# Patient Record
Sex: Male | Born: 1955 | Race: Black or African American | Hispanic: No | Marital: Single | Smoking: Current every day smoker
Health system: Southern US, Community
[De-identification: ages and names within clinical notes are randomized; demographics above are authoritative.]

## PROBLEM LIST (undated history)

## (undated) DIAGNOSIS — K759 Inflammatory liver disease, unspecified: Secondary | ICD-10-CM

## (undated) DIAGNOSIS — M4800 Spinal stenosis, site unspecified: Secondary | ICD-10-CM

## (undated) DIAGNOSIS — E119 Type 2 diabetes mellitus without complications: Secondary | ICD-10-CM

## (undated) DIAGNOSIS — E785 Hyperlipidemia, unspecified: Secondary | ICD-10-CM

## (undated) DIAGNOSIS — M1990 Unspecified osteoarthritis, unspecified site: Secondary | ICD-10-CM

## (undated) HISTORY — PX: HERNIA REPAIR: SHX51

## (undated) HISTORY — PX: KNEE ARTHROSCOPY: SUR90

## (undated) HISTORY — PX: WRIST SURGERY: SHX841

## (undated) HISTORY — PX: EYE SURGERY: SHX253

## (undated) HISTORY — PX: EXCISIONAL HEMORRHOIDECTOMY: SHX1541

---

## 2015-09-08 ENCOUNTER — Ambulatory Visit: Payer: Self-pay | Admitting: Orthopedic Surgery

## 2015-09-08 NOTE — H&P (Signed)
Rod Majerus is an 60 y.o. male.   Chief Complaint:  L lower extremity radicular pain HPI: The patient is a 60 year old male who presents with back pain. The patient is here today in referral from Dr. Ethelene Hal. The patient reports low back symptoms including pain which began 21 week(s) (1 day) ago following a specific injury (DOI 02/03/15). The injury occurred at work due to twisting. and Symptoms include pain (low back). The pain radiates to the left buttock and left posterior thigh. The patient describes the pain as aching. The patient describes the severity of their symptoms as 3 / 10. Symptoms are exacerbated by sitting. Current treatment includes opioid analgesics (Norco) and muscle relaxants (Flexeril). 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 Past evaluation has included x-ray of the lumbar spine and MRI of the lumbar spine. Past treatment has included opioid analgesics (Hydrocodone), muscle relaxants (Flexeril), corticosteroids and L L4-5 facet injection and L L5 SNRB. Note for "Back pain": The patient is working full duty. NCM Neita Carp.  This is a very pleasant gentleman who was kindly referred by Dr. Ethelene Hal for the evaluation of ongoing left lower extremity radicular pain secondary to a work-related injury. The patient reports injured on 02/03/2015 with a direct blow that hit to the left foot with a backhoe caused him to twist in his lower back. He had pain in the low back, left buttock, radiating down into the foot. He was seen at [facility]. X-rays of the lumbar spine and MRI was performed. The patient was treated with oral prednisone, hydrocodone. The patient has significant facet arthropathy at 4-5 and lateral recess and associated synovial cyst impinging upon the L4 and L5 nerve roots. He was seen by Dr. Ethelene Hal. He was started with a selective nerve root block on the left at L5. On the nerve root block, he had reproduction of his pain in  the L5 nerve root distribution to the top of his foot and to the bottom of his foot. He was placed on Norco and felt that the injection overall reduced the pain to his knee as opposed to down below his foot and the thought was to reduce the synovial cyst and the inflammation. He has second injection at L4-5. It seemed to help as well in terms of his back pain, but he has had persistent buttock and leg pain. He is continued to do his regular duties, taking Norco and Flexeril, but does not want to live permanently with these symptoms. Again, he has 21 weeks of duration.  Review of systems is negative for fevers, chest pain, shortness of breath, unexplained recent weight loss, loss of bowel or bladder function, burning with urination, joint swelling, rashes, weakness or numbness, difficulty with balance, easy bruising, excessive thirst or frequent urination.  He is here with his case manager Neita Carp. He has reported no problems prior to that. No fevers or chills, change in bowel or bladder function, pain that awakens him at night, or unexplained recent weight loss. He is an insulin-dependent diabetic and he indicates his A1c is elevated; however, he does not have a particular quantification today.  No past medical history on file.  No past surgical history on file.  No family history on file. Social History:  has no tobacco, alcohol, and drug history on file.  Allergies: Allergies not on file   (Not in a hospital admission)  No results found for this or any previous visit (from the  past 48 hour(s)). No results found.  Review of Systems  Constitutional: Negative.   HENT: Negative.   Eyes: Negative.   Respiratory: Negative.   Cardiovascular: Negative.   Gastrointestinal: Negative.   Genitourinary: Negative.   Musculoskeletal: Positive for back pain.  Skin: Negative.   Neurological: Positive for sensory change and focal weakness.  Psychiatric/Behavioral: Negative.     There were no  vitals taken for this visit. Physical Exam  Constitutional: He is oriented to person, place, and time. He appears well-developed.  HENT:  Head: Normocephalic.  Eyes: Pupils are equal, round, and reactive to light.  Neck: Normal range of motion.  Cardiovascular: Normal rate.   Respiratory: Effort normal.  GI: Soft.  Musculoskeletal:  I see a healthy male, mild amount of distress. Mood and affect is appropriate. Slight antalgic gait. He is tender in the left proximal gluteus, nontender over the SI joint or lumbosacral junction. Straight leg raise, buttock, thigh and calf pain left, negative on the right. EHL is 4+/5 on the left compared to the right. Slightly altered sensation in the L5 dermatome in the plantar aspect of the foot. Good pulses. No DVT. No instability of hips, knees and ankles. He has decreased range of motion of both hips internal and external rotation, but no reproduction of his pain. Nontender over the thoracic spine. Abdomen is soft and nontender. Pelvis is stable. Painless range of motion of the cervical spine is noted.  Neurological: He is alert and oriented to person, place, and time.    Three view radiographs AP, lateral, flexion and extension radiographs of the lumbar spine demonstrates multilevel lumbar spondylosis with facet arthropathy at 4-5 and at 5-1. He has a mild osteoarthrosis of the hips as noted. His MRI done outside of [facility], interpreted by Dr. 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 Moderate neural foraminal narrowing is noted. Probable symptomatic nerve root impingement. There is advanced arthropathy at 5-1. No definitive lateral recess stenosis is noted. Moderate facet arthropathy at 3-4 is noted.  On his x-rays, he has congenitally short  pedicles.  Assessment/Plan 1. 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 He has temporally relief from a L5 selective nerve root block and facet injection. He has ongoing symptoms required narcotic analgesics. He has continued to work however, but he is unsure as to how long he continue with his current level of pain. 2. Tobacco dependence.  Extensive discussion with Mr. Ines concerning his current pathology, relevant anatomy and treatment options. Certainly, he has had excellent treatment today on the appropriate diagnosis. I feel this is related as Dr. Ethelene Hal has indicated, to impingement of the L4 and L5 nerve root on the left secondary to facet arthropathy. He has reproduction of his pain and temporarily relief from a selective nerve root block and facet injection. He is currently utilizing narcotic analgesics as well as Flexeril and has persistent significant pain. He has neural tension signs and EHL weakness. One option is to continue with his current treatment with pain management, activity modification; however, we would most probably expect the ongoing symptoms as they currently exist. Second option is to consider a micro lumbar decompression at 4-5 to decompress the lateral recess  and partial medial hemi-facetectomy with foraminotomies at L4 and L5 and removal of synovial cyst. We did discuss that this would address his radicular or nerve compression pain as was reproduced and diminished by his selective nerve root block. There would most likely be residual symptoms related to his multilevel facet arthropathy. He does not have instability. I would not recommend a concomitant fusion at this point in time given the relative risk and benefit ratio. I would also load the adjacent segments. I did however, indicate that with a any facet  arthropathy that the procedure is directed towards removing neural impingement, this does not eradicate the facet arthropathy at multiple levels and could have ongoing back pain associated with that. He has minimal back pain that is predominantly buttock and leg pain at this point; therefore as well, there is a possibility of a recurrent synovial cyst production and in which case it would be treated in a similar manner. However, given his comorbidities, I believe a minimally invasive approach at this point in time is prudent. Continue with his regular duties, Norco and Flexeril. I discussed this in front of the patient with Neita Carp. We will obtain a preoperative clearance. We will recommend tobacco cessation as well. He has a history of liver disease and hepatitis C. We will proceed as one authorization has been obtained. I appreciate the kind referral and taking this evaluation by Dr. Ethelene Hal.  Plan microlumbar decompression L4-5 left, removal of synovial cyst  BISSELL, JACLYN M. PA-C for Dr. Shelle Iron 09/08/2015, 4:39 PM

## 2015-09-09 NOTE — Patient Instructions (Addendum)
YOUR PROCEDURE IS SCHEDULED ON :  09/15/15  REPORT TO [facility] MAIN ENTRANCE FOLLOW SIGNS TO EAST ELEVATOR - GO TO 3rd FLOOR CHECK IN AT 3 EAST NURSES STATION (SHORT STAY) AT:  5:30 AM  CALL THIS NUMBER IF YOU HAVE PROBLEMS THE MORNING OF SURGERY [phone]  REMEMBER:ONLY 1 PER PERSON MAY GO TO SHORT STAY WITH YOU TO GET READY THE MORNING OF YOUR SURGERY  DO NOT EAT FOOD OR DRINK LIQUIDS AFTER MIDNIGHT  TAKE THESE MEDICINES THE MORNING OF SURGERY: GABAPENTIN / MAY TAKE HYDROCODONE IF NEEDED FOR PAIN  TAKE 1/2 DOSE OF LEVIMIR THE NIGHT BEFORE SURGERY  YOU MAY NOT HAVE ANY METAL ON YOUR BODY INCLUDING HAIR PINS AND PIERCING'S. DO NOT WEAR JEWELRY, MAKEUP, LOTIONS, POWDERS OR PERFUMES. DO NOT WEAR NAIL POLISH. DO NOT SHAVE 48 HRS PRIOR TO SURGERY. MEN MAY SHAVE FACE AND NECK.  DO NOT BRING VALUABLES TO HOSPITAL. [facility] IS NOT RESPONSIBLE FOR VALUABLES.  CONTACTS, DENTURES OR PARTIALS MAY NOT BE WORN TO SURGERY. LEAVE SUITCASE IN CAR. CAN BE BROUGHT TO ROOM AFTER SURGERY.  PATIENTS DISCHARGED THE DAY OF SURGERY WILL NOT BE ALLOWED TO DRIVE HOME.  PLEASE READ OVER THE FOLLOWING INSTRUCTION SHEETS _________________________________________________________________________________                                          [facility] - PREPARING FOR SURGERY  Before surgery, you can play an important role.  Because skin is not sterile, your skin needs to be as free of germs as possible.  You can reduce the number of germs on your skin by washing with CHG (chlorahexidine gluconate) soap before surgery.  CHG is an antiseptic cleaner which kills germs and bonds with the skin to continue killing germs even after washing. Please DO NOT use if you have an allergy to CHG or antibacterial soaps.  If your skin becomes reddened/irritated stop using the CHG and inform your nurse when you arrive at Short Stay. Do not shave (including legs and underarms) for at least 48 hours  prior to the first CHG shower.  You may shave your face. Please follow these instructions carefully:   1.  Shower with CHG Soap the night before surgery and the  morning of Surgery.   2.  If you choose to wash your hair, wash your hair first as usual with your  normal  Shampoo.   3.  After you shampoo, rinse your hair and body thoroughly to remove the  shampoo.                                         4.  Use CHG as you would any other liquid soap.  You can apply chg directly  to the skin and wash . Gently wash with scrungie or clean wascloth    5.  Apply the CHG Soap to your body ONLY FROM THE NECK DOWN.   Do not use on open                           Wound or open sores. Avoid contact with eyes, ears mouth and genitals (private parts).  Genitals (private parts) with your normal soap.              6.  Wash thoroughly, paying special attention to the area where your surgery  will be performed.   7.  Thoroughly rinse your body with warm water from the neck down.   8.  DO NOT shower/wash with your normal soap after using and rinsing off  the CHG Soap .                9.  Pat yourself dry with a clean towel.             10.  Wear clean night clothes to bed after shower             11.  Place clean sheets on your bed the night of your first shower and do not  sleep with pets.  Day of Surgery : Do not apply any lotions/deodorants the morning of surgery.  Please wear clean clothes to the hospital/surgery center.  FAILURE TO FOLLOW THESE INSTRUCTIONS MAY RESULT IN THE CANCELLATION OF YOUR SURGERY    PATIENT SIGNATURE_________________________________  ______________________________________________________________________     Sean Brooks  An incentive spirometer is a tool that can help keep your lungs clear and active. This tool measures how well you are filling your lungs with each breath. Taking long deep breaths may help reverse or decrease the  chance of developing breathing (pulmonary) problems (especially infection) following:  A long period of time when you are unable to move or be active. BEFORE THE PROCEDURE   If the spirometer includes an indicator to show your best effort, your nurse or respiratory therapist will set it to a desired goal.  If possible, sit up straight or lean slightly forward. Try not to slouch.  Hold the incentive spirometer in an upright position. INSTRUCTIONS FOR USE  1. Sit on the edge of your bed if possible, or sit up as far as you can in bed or on a chair. 2. Hold the incentive spirometer in an upright position. 3. Breathe out normally. 4. Place the mouthpiece in your mouth and seal your lips tightly around it. 5. Breathe in slowly and as deeply as possible, raising the piston or the ball toward the top of the column. 6. Hold your breath for 3-5 seconds or for as long as possible. Allow the piston or ball to fall to the bottom of the column. 7. Remove the mouthpiece from your mouth and breathe out normally. 8. Rest for a few seconds and repeat Steps 1 through 7 at least 10 times every 1-2 hours when you are awake. Take your time and take a few normal breaths between deep breaths. 9. The spirometer may include an indicator to show your best effort. Use the indicator as a goal to work toward during each repetition. 10. After each set of 10 deep breaths, practice coughing to be sure your lungs are clear. If you have an incision (the cut made at the time of surgery), support your incision when coughing by placing a pillow or rolled up towels firmly against it. Once you are able to get out of bed, walk around indoors and cough well. You may stop using the incentive spirometer when instructed by your caregiver.  RISKS AND COMPLICATIONS  Take your time so you do not get dizzy or light-headed.  If you are in pain, you may need to take or ask for pain medication before doing incentive spirometry. It is  harder  to take a deep breath if you are having pain. AFTER USE  Rest and breathe slowly and easily.  It can be helpful to keep track of a log of your progress. Your caregiver can provide you with a simple table to help with this. If you are using the spirometer at home, follow these instructions: [place] IF:   You are having difficultly using the spirometer.  You have trouble using the spirometer as often as instructed.  Your pain medication is not giving enough relief while using the spirometer.  You develop fever of 100.5 F (38.1 C) or higher. SEEK IMMEDIATE MEDICAL CARE IF:   You cough up bloody sputum that had not been present before.  You develop fever of 102 F (38.9 C) or greater.  You develop worsening pain at or near the incision site. MAKE SURE YOU:   Understand these instructions.  Will watch your condition.  Will get help right away if you are not doing well or get worse. Document Released: 12/18/2006 Document Revised: 10/30/2011 Document Reviewed: 02/18/2007 [facility] Patient Information 2014 [place], [place].   ________________________________________________________________________

## 2015-09-10 ENCOUNTER — Encounter (HOSPITAL_COMMUNITY)
Admission: RE | Admit: 2015-09-10 | Discharge: 2015-09-10 | Disposition: A | Discharge status: Home / Self Care | Payer: Worker's Compensation | Source: Ambulatory Visit | Attending: Specialist | Admitting: Specialist

## 2015-09-10 ENCOUNTER — Ambulatory Visit (HOSPITAL_COMMUNITY)
Admission: RE | Admit: 2015-09-10 | Discharge: 2015-09-10 | Disposition: A | Discharge status: Home / Self Care | Payer: Worker's Compensation | Source: Ambulatory Visit | Attending: Orthopedic Surgery | Admitting: Orthopedic Surgery

## 2015-09-10 ENCOUNTER — Encounter (HOSPITAL_COMMUNITY): Payer: Self-pay

## 2015-09-10 DIAGNOSIS — R9431 Abnormal electrocardiogram [ECG] [EKG]: Secondary | ICD-10-CM | POA: Insufficient documentation

## 2015-09-10 DIAGNOSIS — Z01812 Encounter for preprocedural laboratory examination: Secondary | ICD-10-CM | POA: Insufficient documentation

## 2015-09-10 DIAGNOSIS — M4316 Spondylolisthesis, lumbar region: Secondary | ICD-10-CM | POA: Diagnosis not present

## 2015-09-10 DIAGNOSIS — M47896 Other spondylosis, lumbar region: Secondary | ICD-10-CM | POA: Diagnosis not present

## 2015-09-10 DIAGNOSIS — E119 Type 2 diabetes mellitus without complications: Secondary | ICD-10-CM | POA: Insufficient documentation

## 2015-09-10 DIAGNOSIS — Z01810 Encounter for preprocedural cardiovascular examination: Secondary | ICD-10-CM | POA: Diagnosis not present

## 2015-09-10 DIAGNOSIS — M4806 Spinal stenosis, lumbar region: Secondary | ICD-10-CM | POA: Insufficient documentation

## 2015-09-10 DIAGNOSIS — M48061 Spinal stenosis, lumbar region without neurogenic claudication: Secondary | ICD-10-CM

## 2015-09-10 DIAGNOSIS — Z01818 Encounter for other preprocedural examination: Secondary | ICD-10-CM | POA: Diagnosis present

## 2015-09-10 HISTORY — DX: Type 2 diabetes mellitus without complications: E11.9

## 2015-09-10 HISTORY — DX: Unspecified osteoarthritis, unspecified site: M19.90

## 2015-09-10 HISTORY — DX: Spinal stenosis, site unspecified: M48.00

## 2015-09-10 HISTORY — DX: Inflammatory liver disease, unspecified: K75.9

## 2015-09-10 HISTORY — DX: Hyperlipidemia, unspecified: E78.5

## 2015-09-10 LAB — SURGICAL PCR SCREEN
MRSA, PCR: NEGATIVE
Staphylococcus aureus: NEGATIVE

## 2015-09-10 LAB — CBC
HEMATOCRIT: 43.4 % (ref 39.0–52.0)
Hemoglobin: 12.8 g/dL — ABNORMAL LOW (ref 13.0–17.0)
MCH: 25.1 pg — AB (ref 26.0–34.0)
MCHC: 29.5 g/dL — AB (ref 30.0–36.0)
MCV: 85.3 fL (ref 78.0–100.0)
Platelets: 373 10*3/uL (ref 150–400)
RBC: 5.09 MIL/uL (ref 4.22–5.81)
RDW: 15.1 % (ref 11.5–15.5)
WBC: 7.6 10*3/uL (ref 4.0–10.5)

## 2015-09-10 LAB — BASIC METABOLIC PANEL
Anion gap: 8 (ref 5–15)
BUN: 18 mg/dL (ref 6–20)
CHLORIDE: 107 mmol/L (ref 101–111)
CO2: 24 mmol/L (ref 22–32)
Calcium: 9.2 mg/dL (ref 8.9–10.3)
Creatinine, Ser: 1.11 mg/dL (ref 0.61–1.24)
GFR calc Af Amer: >60 mL/min (ref >60)
GFR calc non Af Amer: >60 mL/min (ref >60)
GLUCOSE: 63 mg/dL — AB (ref 65–99)
POTASSIUM: 4.6 mmol/L (ref 3.5–5.1)
Sodium: 139 mmol/L (ref 135–145)

## 2015-09-11 LAB — HEMOGLOBIN A1C
HEMOGLOBIN A1C: 8.5 % — AB (ref 4.8–5.6)
Mean Plasma Glucose: 197 mg/dL

## 2015-09-15 ENCOUNTER — Ambulatory Visit (HOSPITAL_COMMUNITY): Payer: Worker's Compensation

## 2015-09-15 ENCOUNTER — Ambulatory Visit (HOSPITAL_COMMUNITY)
Admission: RE | Admit: 2015-09-15 | Discharge: 2015-09-16 | Disposition: A | Discharge status: Home / Self Care | Payer: Worker's Compensation | Source: Ambulatory Visit | Attending: Specialist | Admitting: Specialist

## 2015-09-15 ENCOUNTER — Encounter (HOSPITAL_COMMUNITY): Payer: Self-pay

## 2015-09-15 ENCOUNTER — Encounter (HOSPITAL_COMMUNITY)
Admission: RE | Disposition: A | Discharge status: Home / Self Care | Payer: Self-pay | Source: Ambulatory Visit | Attending: Specialist

## 2015-09-15 ENCOUNTER — Ambulatory Visit (HOSPITAL_COMMUNITY): Payer: Worker's Compensation | Admitting: Anesthesiology

## 2015-09-15 DIAGNOSIS — I10 Essential (primary) hypertension: Secondary | ICD-10-CM | POA: Diagnosis not present

## 2015-09-15 DIAGNOSIS — Z6836 Body mass index (BMI) 36.0-36.9, adult: Secondary | ICD-10-CM | POA: Diagnosis not present

## 2015-09-15 DIAGNOSIS — M48061 Spinal stenosis, lumbar region without neurogenic claudication: Secondary | ICD-10-CM

## 2015-09-15 DIAGNOSIS — J449 Chronic obstructive pulmonary disease, unspecified: Secondary | ICD-10-CM | POA: Insufficient documentation

## 2015-09-15 DIAGNOSIS — B1920 Unspecified viral hepatitis C without hepatic coma: Secondary | ICD-10-CM | POA: Insufficient documentation

## 2015-09-15 DIAGNOSIS — M7138 Other bursal cyst, other site: Secondary | ICD-10-CM | POA: Insufficient documentation

## 2015-09-15 DIAGNOSIS — E785 Hyperlipidemia, unspecified: Secondary | ICD-10-CM | POA: Diagnosis not present

## 2015-09-15 DIAGNOSIS — M4806 Spinal stenosis, lumbar region: Secondary | ICD-10-CM | POA: Diagnosis not present

## 2015-09-15 DIAGNOSIS — Z794 Long term (current) use of insulin: Secondary | ICD-10-CM | POA: Insufficient documentation

## 2015-09-15 DIAGNOSIS — F17210 Nicotine dependence, cigarettes, uncomplicated: Secondary | ICD-10-CM | POA: Insufficient documentation

## 2015-09-15 DIAGNOSIS — E119 Type 2 diabetes mellitus without complications: Secondary | ICD-10-CM | POA: Diagnosis not present

## 2015-09-15 DIAGNOSIS — Z419 Encounter for procedure for purposes other than remedying health state, unspecified: Secondary | ICD-10-CM

## 2015-09-15 HISTORY — PX: LUMBAR LAMINECTOMY/DECOMPRESSION MICRODISCECTOMY: SHX5026

## 2015-09-15 LAB — GLUCOSE, CAPILLARY
GLUCOSE-CAPILLARY: 156 mg/dL — AB (ref 65–99)
GLUCOSE-CAPILLARY: 242 mg/dL — AB (ref 65–99)
Glucose-Capillary: 172 mg/dL — ABNORMAL HIGH (ref 65–99)
Glucose-Capillary: 164 mg/dL — ABNORMAL HIGH (ref 65–99)
Glucose-Capillary: 177 mg/dL — ABNORMAL HIGH (ref 65–99)

## 2015-09-15 LAB — HEPATIC FUNCTION PANEL
ALK PHOS: 70 U/L (ref 38–126)
ALT: 39 U/L (ref 17–63)
AST: 34 U/L (ref 15–41)
Albumin: 3.3 g/dL — ABNORMAL LOW (ref 3.5–5.0)
Total Bilirubin: 0.5 mg/dL (ref 0.3–1.2)
Total Protein: 6.8 g/dL (ref 6.5–8.1)

## 2015-09-15 SURGERY — LUMBAR LAMINECTOMY/DECOMPRESSION MICRODISCECTOMY
Anesthesia: General | Site: Back | Laterality: Left

## 2015-09-15 MED ORDER — INSULIN DETEMIR 100 UNIT/ML ~~LOC~~ SOLN
55.0000 [IU] | Freq: Every day | SUBCUTANEOUS | Status: DC
Start: 2015-09-15 — End: 2015-09-15

## 2015-09-15 MED ORDER — OXYCODONE-ACETAMINOPHEN 5-325 MG PO TABS
1.0000 | ORAL_TABLET | ORAL | Status: DC | PRN
  Administered 2015-09-16 (×2): 2 via ORAL
  Filled 2015-09-15 (×2): qty 2

## 2015-09-15 MED ORDER — DOCUSATE SODIUM 100 MG PO CAPS
100.0000 mg | ORAL_CAPSULE | Freq: Two times a day (BID) | ORAL | Status: DC
  Administered 2015-09-15 – 2015-09-16 (×2): 100 mg via ORAL

## 2015-09-15 MED ORDER — BISACODYL 5 MG PO TBEC: 5.0000 mg | DELAYED_RELEASE_TABLET | Freq: Every day | ORAL | Status: DC | PRN

## 2015-09-15 MED ORDER — ALUM & MAG HYDROXIDE-SIMETH 200-200-20 MG/5ML PO SUSP: 30.0000 mL | Freq: Four times a day (QID) | ORAL | Status: DC | PRN

## 2015-09-15 MED ORDER — SENNOSIDES-DOCUSATE SODIUM 8.6-50 MG PO TABS: 1.0000 | ORAL_TABLET | Freq: Every evening | ORAL | Status: DC | PRN

## 2015-09-15 MED ORDER — PROMETHAZINE HCL 25 MG/ML IJ SOLN: 6.2500 mg | INTRAMUSCULAR | Status: DC | PRN

## 2015-09-15 MED ORDER — LACTATED RINGERS IV SOLN
INTRAVENOUS | Status: DC
  Administered 2015-09-15 (×2): via INTRAVENOUS

## 2015-09-15 MED ORDER — INSULIN ASPART 100 UNIT/ML ~~LOC~~ SOLN
15.0000 [IU] | Freq: Every day | SUBCUTANEOUS | Status: DC
  Administered 2015-09-16: 15 [IU] via SUBCUTANEOUS

## 2015-09-15 MED ORDER — ONDANSETRON HCL 4 MG/2ML IJ SOLN: 4.0000 mg | INTRAMUSCULAR | Status: DC | PRN

## 2015-09-15 MED ORDER — HYDROMORPHONE HCL 1 MG/ML IJ SOLN
INTRAMUSCULAR | Status: AC
Start: 2015-09-15 — End: 2015-09-15
  Filled 2015-09-15: qty 1

## 2015-09-15 MED ORDER — MEPERIDINE HCL 50 MG/ML IJ SOLN: 6.2500 mg | INTRAMUSCULAR | Status: DC | PRN

## 2015-09-15 MED ORDER — INSULIN ASPART 100 UNIT/ML ~~LOC~~ SOLN
0.0000 [IU] | Freq: Three times a day (TID) | SUBCUTANEOUS | Status: DC
  Administered 2015-09-15: 3 [IU] via SUBCUTANEOUS
  Administered 2015-09-16: 8 [IU] via SUBCUTANEOUS

## 2015-09-15 MED ORDER — INSULIN DETEMIR 100 UNIT/ML ~~LOC~~ SOLN: 55.0000 [IU] | Freq: Every day | SUBCUTANEOUS | Status: DC

## 2015-09-15 MED ORDER — HYDROMORPHONE HCL 1 MG/ML IJ SOLN
INTRAMUSCULAR | Status: DC | PRN
  Administered 2015-09-15: 0.5 mg via INTRAVENOUS

## 2015-09-15 MED ORDER — HYDROMORPHONE HCL 1 MG/ML IJ SOLN
0.2500 mg | INTRAMUSCULAR | Status: DC | PRN
  Administered 2015-09-15 (×4): 0.5 mg via INTRAVENOUS

## 2015-09-15 MED ORDER — CEFAZOLIN SODIUM-DEXTROSE 2-3 GM-% IV SOLR
INTRAVENOUS | Status: AC
  Filled 2015-09-15: qty 50

## 2015-09-15 MED ORDER — HYDROCODONE-ACETAMINOPHEN 5-325 MG PO TABS
ORAL_TABLET | ORAL | Status: AC
  Filled 2015-09-15: qty 2

## 2015-09-15 MED ORDER — INSULIN ASPART 100 UNIT/ML ~~LOC~~ SOLN: 10.0000 [IU] | Freq: Every day | SUBCUTANEOUS | Status: DC

## 2015-09-15 MED ORDER — INSULIN DETEMIR 100 UNIT/ML ~~LOC~~ SOLN
65.0000 [IU] | Freq: Every day | SUBCUTANEOUS | Status: DC
  Administered 2015-09-16: 65 [IU] via SUBCUTANEOUS
  Filled 2015-09-15: qty 0.65

## 2015-09-15 MED ORDER — PHENOL 1.4 % MT LIQD: 1.0000 | OROMUCOSAL | Status: DC | PRN

## 2015-09-15 MED ORDER — DOCUSATE SODIUM 100 MG PO CAPS: 100.0000 mg | ORAL_CAPSULE | Freq: Two times a day (BID) | ORAL | Status: AC | PRN

## 2015-09-15 MED ORDER — CEFAZOLIN SODIUM-DEXTROSE 2-3 GM-% IV SOLR
2.0000 g | Freq: Three times a day (TID) | INTRAVENOUS | Status: AC
  Administered 2015-09-15 – 2015-09-16 (×3): 2 g via INTRAVENOUS
  Filled 2015-09-15 (×3): qty 50

## 2015-09-15 MED ORDER — PROPOFOL 10 MG/ML IV BOLUS
INTRAVENOUS | Status: AC
  Filled 2015-09-15: qty 20

## 2015-09-15 MED ORDER — MIDAZOLAM HCL 2 MG/2ML IJ SOLN: 0.5000 mg | Freq: Once | INTRAMUSCULAR | Status: DC | PRN

## 2015-09-15 MED ORDER — BUPIVACAINE-EPINEPHRINE (PF) 0.5% -1:200000 IJ SOLN
INTRAMUSCULAR | Status: AC
  Filled 2015-09-15: qty 30

## 2015-09-15 MED ORDER — SODIUM CHLORIDE 0.9 % IR SOLN
Status: AC
  Filled 2015-09-15: qty 1

## 2015-09-15 MED ORDER — THROMBIN 5000 UNITS EX SOLR
CUTANEOUS | Status: DC | PRN
  Administered 2015-09-15: 10000 [IU] via TOPICAL

## 2015-09-15 MED ORDER — INSULIN DETEMIR 100 UNIT/ML ~~LOC~~ SOLN
55.0000 [IU] | Freq: Every day | SUBCUTANEOUS | Status: DC
  Administered 2015-09-15: 55 [IU] via SUBCUTANEOUS
  Filled 2015-09-15 (×2): qty 0.55

## 2015-09-15 MED ORDER — BUPIVACAINE-EPINEPHRINE 0.5% -1:200000 IJ SOLN
INTRAMUSCULAR | Status: DC | PRN
  Administered 2015-09-15: 12 mL

## 2015-09-15 MED ORDER — METHOCARBAMOL 1000 MG/10ML IJ SOLN
500.0000 mg | Freq: Four times a day (QID) | INTRAVENOUS | Status: DC | PRN
  Administered 2015-09-15: 500 mg via INTRAVENOUS
  Filled 2015-09-15 (×2): qty 5

## 2015-09-15 MED ORDER — FENTANYL CITRATE (PF) 100 MCG/2ML IJ SOLN
INTRAMUSCULAR | Status: DC | PRN
  Administered 2015-09-15: 50 ug via INTRAVENOUS
  Administered 2015-09-15 (×2): 100 ug via INTRAVENOUS

## 2015-09-15 MED ORDER — HYDROMORPHONE HCL 1 MG/ML IJ SOLN
INTRAMUSCULAR | Status: AC
  Filled 2015-09-15: qty 1

## 2015-09-15 MED ORDER — ROCURONIUM BROMIDE 100 MG/10ML IV SOLN
INTRAVENOUS | Status: AC
  Filled 2015-09-15: qty 1

## 2015-09-15 MED ORDER — LIDOCAINE HCL (CARDIAC) 20 MG/ML IV SOLN
INTRAVENOUS | Status: DC | PRN
  Administered 2015-09-15: 50 mg via INTRAVENOUS

## 2015-09-15 MED ORDER — HYDROCODONE-ACETAMINOPHEN 5-325 MG PO TABS
1.0000 | ORAL_TABLET | ORAL | Status: DC | PRN
  Administered 2015-09-15 (×2): 2 via ORAL
  Filled 2015-09-15: qty 2

## 2015-09-15 MED ORDER — ACETAMINOPHEN 650 MG RE SUPP: 650.0000 mg | RECTAL | Status: DC | PRN

## 2015-09-15 MED ORDER — MIDAZOLAM HCL 2 MG/2ML IJ SOLN
INTRAMUSCULAR | Status: AC
  Filled 2015-09-15: qty 2

## 2015-09-15 MED ORDER — ROCURONIUM BROMIDE 100 MG/10ML IV SOLN
INTRAVENOUS | Status: DC | PRN
  Administered 2015-09-15 (×2): 5 mg via INTRAVENOUS
  Administered 2015-09-15: 40 mg via INTRAVENOUS

## 2015-09-15 MED ORDER — POTASSIUM CHLORIDE IN NACL 20-0.9 MEQ/L-% IV SOLN
INTRAVENOUS | Status: DC
Start: 2015-09-15 — End: 2015-09-16
  Administered 2015-09-15: 15:00:00 via INTRAVENOUS
  Filled 2015-09-15 (×2): qty 1000

## 2015-09-15 MED ORDER — HYDROMORPHONE HCL 1 MG/ML IJ SOLN
0.5000 mg | INTRAMUSCULAR | Status: DC | PRN
  Administered 2015-09-15: 1 mg via INTRAVENOUS
  Filled 2015-09-15: qty 1

## 2015-09-15 MED ORDER — PHENYLEPHRINE HCL 10 MG/ML IJ SOLN
INTRAMUSCULAR | Status: AC
  Filled 2015-09-15: qty 1

## 2015-09-15 MED ORDER — RISAQUAD PO CAPS
1.0000 | ORAL_CAPSULE | Freq: Every day | ORAL | Status: DC
  Administered 2015-09-16: 1 via ORAL
  Filled 2015-09-15 (×2): qty 1

## 2015-09-15 MED ORDER — INSULIN DETEMIR 100 UNIT/ML ~~LOC~~ SOLN
65.0000 [IU] | Freq: Every day | SUBCUTANEOUS | Status: DC
  Filled 2015-09-15: qty 0.65

## 2015-09-15 MED ORDER — ACETAMINOPHEN 325 MG PO TABS: 650.0000 mg | ORAL_TABLET | ORAL | Status: DC | PRN

## 2015-09-15 MED ORDER — SODIUM CHLORIDE 0.9 % IR SOLN
Status: DC | PRN
  Administered 2015-09-15: 500 mL

## 2015-09-15 MED ORDER — NEOSTIGMINE METHYLSULFATE 10 MG/10ML IV SOLN
INTRAVENOUS | Status: DC | PRN
  Administered 2015-09-15: 5 mg via INTRAVENOUS

## 2015-09-15 MED ORDER — FENTANYL CITRATE (PF) 250 MCG/5ML IJ SOLN
INTRAMUSCULAR | Status: AC
  Filled 2015-09-15: qty 5

## 2015-09-15 MED ORDER — OXYCODONE-ACETAMINOPHEN 5-325 MG PO TABS
1.0000 | ORAL_TABLET | ORAL | Status: AC | PRN
Start: 2015-09-15 — End: ?

## 2015-09-15 MED ORDER — METHOCARBAMOL 500 MG PO TABS
500.0000 mg | ORAL_TABLET | Freq: Four times a day (QID) | ORAL | Status: DC | PRN
  Administered 2015-09-15 – 2015-09-16 (×2): 500 mg via ORAL
  Filled 2015-09-15 (×2): qty 1

## 2015-09-15 MED ORDER — THROMBIN 5000 UNITS EX SOLR
CUTANEOUS | Status: AC
  Filled 2015-09-15: qty 10000

## 2015-09-15 MED ORDER — HYDROMORPHONE HCL 2 MG/ML IJ SOLN
INTRAMUSCULAR | Status: AC
  Filled 2015-09-15: qty 1

## 2015-09-15 MED ORDER — MAGNESIUM CITRATE PO SOLN: 1.0000 | Freq: Once | ORAL | Status: DC | PRN

## 2015-09-15 MED ORDER — CEFAZOLIN SODIUM-DEXTROSE 2-3 GM-% IV SOLR
2.0000 g | INTRAVENOUS | Status: AC
  Administered 2015-09-15: 2 g via INTRAVENOUS

## 2015-09-15 MED ORDER — PHENYLEPHRINE HCL 10 MG/ML IJ SOLN
INTRAMUSCULAR | Status: DC | PRN
  Administered 2015-09-15: 40 ug via INTRAVENOUS

## 2015-09-15 MED ORDER — LIDOCAINE HCL (CARDIAC) 20 MG/ML IV SOLN
INTRAVENOUS | Status: AC
  Filled 2015-09-15: qty 5

## 2015-09-15 MED ORDER — PROPOFOL 10 MG/ML IV BOLUS
INTRAVENOUS | Status: DC | PRN
  Administered 2015-09-15: 200 mg via INTRAVENOUS

## 2015-09-15 MED ORDER — GABAPENTIN 300 MG PO CAPS
600.0000 mg | ORAL_CAPSULE | Freq: Two times a day (BID) | ORAL | Status: DC
  Administered 2015-09-15 – 2015-09-16 (×3): 600 mg via ORAL
  Filled 2015-09-15 (×6): qty 2

## 2015-09-15 MED ORDER — GLYCOPYRROLATE 0.2 MG/ML IJ SOLN
INTRAMUSCULAR | Status: DC | PRN
  Administered 2015-09-15: .8 mg via INTRAVENOUS

## 2015-09-15 MED ORDER — ONDANSETRON HCL 4 MG/2ML IJ SOLN
INTRAMUSCULAR | Status: AC
  Filled 2015-09-15: qty 2

## 2015-09-15 MED ORDER — MENTHOL 3 MG MT LOZG: 1.0000 | LOZENGE | OROMUCOSAL | Status: DC | PRN

## 2015-09-15 MED ORDER — MIDAZOLAM HCL 5 MG/5ML IJ SOLN
INTRAMUSCULAR | Status: DC | PRN
  Administered 2015-09-15: 2 mg via INTRAVENOUS

## 2015-09-15 MED ORDER — INSULIN ASPART 100 UNIT/ML ~~LOC~~ SOLN: 20.0000 [IU] | Freq: Every day | SUBCUTANEOUS | Status: DC

## 2015-09-15 MED ORDER — ONDANSETRON HCL 4 MG/2ML IJ SOLN
INTRAMUSCULAR | Status: DC | PRN
  Administered 2015-09-15: 4 mg via INTRAVENOUS

## 2015-09-15 SURGICAL SUPPLY — 43 items
BAG ZIPLOCK 12X15 (MISCELLANEOUS) IMPLANT
CLEANER TIP ELECTROSURG 2X2 (MISCELLANEOUS) ×2 IMPLANT
CLOTH 2% CHLOROHEXIDINE 3PK (PERSONAL CARE ITEMS) ×2 IMPLANT
DRAPE MICROSCOPE LEICA (MISCELLANEOUS) ×2 IMPLANT
DRAPE SHEET LG 3/4 BI-LAMINATE (DRAPES) IMPLANT
DRAPE SURG 17X11 SM STRL (DRAPES) ×2 IMPLANT
DRAPE UTILITY XL STRL (DRAPES) ×2 IMPLANT
DRSG AQUACEL AG ADV 3.5X 4 (GAUZE/BANDAGES/DRESSINGS) IMPLANT
DRSG AQUACEL AG ADV 3.5X 6 (GAUZE/BANDAGES/DRESSINGS) ×2 IMPLANT
DURAPREP 26ML APPLICATOR (WOUND CARE) ×2 IMPLANT
DURASEAL SPINE SEALANT 3ML (MISCELLANEOUS) IMPLANT
ELECT BLADE TIP CTD 4 INCH (ELECTRODE) ×2 IMPLANT
ELECT REM PT RETURN 9FT ADLT (ELECTROSURGICAL) ×2
ELECTRODE REM PT RTRN 9FT ADLT (ELECTROSURGICAL) ×1 IMPLANT
GLOVE BIOGEL PI IND STRL 7.0 (GLOVE) ×1 IMPLANT
GLOVE BIOGEL PI INDICATOR 7.0 (GLOVE) ×1
GLOVE SURG SS PI 7.0 STRL IVOR (GLOVE) ×2 IMPLANT
GLOVE SURG SS PI 7.5 STRL IVOR (GLOVE) ×2 IMPLANT
GLOVE SURG SS PI 8.0 STRL IVOR (GLOVE) ×4 IMPLANT
GOWN STRL REUS W/TWL XL LVL3 (GOWN DISPOSABLE) ×4 IMPLANT
IV CATH 14GX2 1/4 (CATHETERS) IMPLANT
KIT BASIN OR (CUSTOM PROCEDURE TRAY) ×2 IMPLANT
KIT POSITIONING SURG ANDREWS (MISCELLANEOUS) ×2 IMPLANT
MANIFOLD NEPTUNE II (INSTRUMENTS) ×2 IMPLANT
NEEDLE SPNL 18GX3.5 QUINCKE PK (NEEDLE) ×4 IMPLANT
PACK LAMINECTOMY ORTHO (CUSTOM PROCEDURE TRAY) ×2 IMPLANT
PATTIES SURGICAL .5 X.5 (GAUZE/BANDAGES/DRESSINGS) IMPLANT
PATTIES SURGICAL .75X.75 (GAUZE/BANDAGES/DRESSINGS) ×2 IMPLANT
RUBBERBAND STERILE (MISCELLANEOUS) ×4 IMPLANT
SPONGE SURGIFOAM ABS GEL 100 (HEMOSTASIS) ×2 IMPLANT
STAPLER VISISTAT (STAPLE) IMPLANT
STRIP CLOSURE SKIN 1/2X4 (GAUZE/BANDAGES/DRESSINGS) ×2 IMPLANT
SUT NURALON 4 0 TR CR/8 (SUTURE) IMPLANT
SUT PROLENE 3 0 PS 2 (SUTURE) IMPLANT
SUT VIC AB 1 CT1 27 (SUTURE)
SUT VIC AB 1 CT1 27XBRD ANTBC (SUTURE) IMPLANT
SUT VIC AB 1-0 CT2 27 (SUTURE) IMPLANT
SUT VIC AB 2-0 CT1 27 (SUTURE)
SUT VIC AB 2-0 CT1 TAPERPNT 27 (SUTURE) IMPLANT
SUT VIC AB 2-0 CT2 27 (SUTURE) IMPLANT
SYR 3ML LL SCALE MARK (SYRINGE) IMPLANT
TOWEL OR 17X26 10 PK STRL BLUE (TOWEL DISPOSABLE) ×2 IMPLANT
YANKAUER SUCT BULB TIP NO VENT (SUCTIONS) ×2 IMPLANT

## 2015-09-15 NOTE — Anesthesia Postprocedure Evaluation (Signed)
Anesthesia Post Note  Patient: Sean Brooks  Procedure(s) Performed: Procedure(s) (LRB): MICRO LUMBAR DECOMPRESSION L4-L5 LEFT AND REMOVAL SYNOVIAL CYST  (Left)  Patient location during evaluation: PACU Anesthesia Type: General Level of consciousness: awake and alert, oriented and patient cooperative Pain management: pain level controlled Vital Signs Assessment: post-procedure vital signs reviewed and stable Respiratory status: spontaneous breathing, nonlabored ventilation, respiratory function stable and patient connected to nasal cannula oxygen Cardiovascular status: blood pressure returned to baseline and stable Postop Assessment: no signs of nausea or vomiting Anesthetic complications: no    Last Vitals:  Filed Vitals:   09/15/15 1230 09/15/15 1247  BP: 151/89 152/90  Pulse: 93 95  Temp:  36.9 C  Resp: 16 16    Last Pain:  Filed Vitals:   09/15/15 1308  PainSc: 3                  Ivan Maskell,E. Emmauel Hallums

## 2015-09-15 NOTE — H&P (View-Only) (Signed)
Sean Brooks is an 60 y.o. male.   Chief Complaint:  L lower extremity radicular pain HPI: The patient is a 60 year old male who presents with back pain. The patient is here today in referral from Dr. Ramos. The patient reports low back symptoms including pain which began 21 week(s) (1 day) ago following a specific injury (DOI 02/03/15). The injury occurred at work due to twisting. and Symptoms include pain (low back). The pain radiates to the left buttock and left posterior thigh. The patient describes the pain as aching. The patient describes the severity of their symptoms as 3 / 10. Symptoms are exacerbated by sitting. Current treatment includes opioid analgesics (Norco) and muscle relaxants (Flexeril). Prior to being seen today the patient was previously evaluated [facility]. Past evaluation has included x-ray of the lumbar spine and MRI of the lumbar spine. Past treatment has included opioid analgesics (Hydrocodone), muscle relaxants (Flexeril), corticosteroids and L L4-5 facet injection and L L5 SNRB. Note for "Back pain": The patient is working full duty. NCM Steve Johnson.  This is a very pleasant gentleman who was kindly referred by Dr. Ramos for the evaluation of ongoing left lower extremity radicular pain secondary to a work-related injury. The patient reports injured on 02/03/2015 with a direct blow that hit to the left foot with a backhoe caused him to twist in his lower back. He had pain in the low back, left buttock, radiating down into the foot. He was seen at [facility]. X-rays of the lumbar spine and MRI was performed. The patient was treated with oral prednisone, hydrocodone. The patient has significant facet arthropathy at 4-5 and lateral recess and associated synovial cyst impinging upon the L4 and L5 nerve roots. He was seen by Dr. Ramos. He was started with a selective nerve root block on the left at L5. On the nerve root block, he had reproduction of his pain in  the L5 nerve root distribution to the top of his foot and to the bottom of his foot. He was placed on Norco and felt that the injection overall reduced the pain to his knee as opposed to down below his foot and the thought was to reduce the synovial cyst and the inflammation. He has second injection at L4-5. It seemed to help as well in terms of his back pain, but he has had persistent buttock and leg pain. He is continued to do his regular duties, taking Norco and Flexeril, but does not want to live permanently with these symptoms. Again, he has 21 weeks of duration.  Review of systems is negative for fevers, chest pain, shortness of breath, unexplained recent weight loss, loss of bowel or bladder function, burning with urination, joint swelling, rashes, weakness or numbness, difficulty with balance, easy bruising, excessive thirst or frequent urination.  He is here with his case manager Steve Johnson. He has reported no problems prior to that. No fevers or chills, change in bowel or bladder function, pain that awakens him at night, or unexplained recent weight loss. He is an insulin-dependent diabetic and he indicates his A1c is elevated; however, he does not have a particular quantification today.  No past medical history on file.  No past surgical history on file.  No family history on file. Social History:  has no tobacco, alcohol, and drug history on file.  Allergies: Allergies not on file   (Not in a hospital admission)  No results found for this or any previous visit (from the   past 48 hour(s)). No results found.  Review of Systems  Constitutional: Negative.   HENT: Negative.   Eyes: Negative.   Respiratory: Negative.   Cardiovascular: Negative.   Gastrointestinal: Negative.   Genitourinary: Negative.   Musculoskeletal: Positive for back pain.  Skin: Negative.   Neurological: Positive for sensory change and focal weakness.  Psychiatric/Behavioral: Negative.     There were no  vitals taken for this visit. Physical Exam  Constitutional: He is oriented to person, place, and time. He appears well-developed.  HENT:  Head: Normocephalic.  Eyes: Pupils are equal, round, and reactive to light.  Neck: Normal range of motion.  Cardiovascular: Normal rate.   Respiratory: Effort normal.  GI: Soft.  Musculoskeletal:  I see a healthy male, mild amount of distress. Mood and affect is appropriate. Slight antalgic gait. He is tender in the left proximal gluteus, nontender over the SI joint or lumbosacral junction. Straight leg raise, buttock, thigh and calf pain left, negative on the right. EHL is 4+/5 on the left compared to the right. Slightly altered sensation in the L5 dermatome in the plantar aspect of the foot. Good pulses. No DVT. No instability of hips, knees and ankles. He has decreased range of motion of both hips internal and external rotation, but no reproduction of his pain. Nontender over the thoracic spine. Abdomen is soft and nontender. Pelvis is stable. Painless range of motion of the cervical spine is noted.  Neurological: He is alert and oriented to person, place, and time.    Three view radiographs AP, lateral, flexion and extension radiographs of the lumbar spine demonstrates multilevel lumbar spondylosis with facet arthropathy at 4-5 and at 5-1. He has a mild osteoarthrosis of the hips as noted. His MRI done outside of [facility], interpreted by Dr. 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 Moderate neural foraminal narrowing is noted. Probable symptomatic nerve root impingement. There is advanced arthropathy at 5-1. No definitive lateral recess stenosis is noted. Moderate facet arthropathy at 3-4 is noted.  On his x-rays, he has congenitally short  pedicles.  Assessment/Plan 1. 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 He has temporally relief from a L5 selective nerve root block and facet injection. He has ongoing symptoms required narcotic analgesics. He has continued to work however, but he is unsure as to how long he continue with his current level of pain. 2. Tobacco dependence.  Extensive discussion with Mr. Silber concerning his current pathology, relevant anatomy and treatment options. Certainly, he has had excellent treatment today on the appropriate diagnosis. I feel this is related as Dr. Ramos has indicated, to impingement of the L4 and L5 nerve root on the left secondary to facet arthropathy. He has reproduction of his pain and temporarily relief from a selective nerve root block and facet injection. He is currently utilizing narcotic analgesics as well as Flexeril and has persistent significant pain. He has neural tension signs and EHL weakness. One option is to continue with his current treatment with pain management, activity modification; however, we would most probably expect the ongoing symptoms as they currently exist. Second option is to consider a micro lumbar decompression at 4-5 to decompress the lateral recess   and partial medial hemi-facetectomy with foraminotomies at L4 and L5 and removal of synovial cyst. We did discuss that this would address his radicular or nerve compression pain as was reproduced and diminished by his selective nerve root block. There would most likely be residual symptoms related to his multilevel facet arthropathy. He does not have instability. I would not recommend a concomitant fusion at this point in time given the relative risk and benefit ratio. I would also load the adjacent segments. I did however, indicate that with a any facet  arthropathy that the procedure is directed towards removing neural impingement, this does not eradicate the facet arthropathy at multiple levels and could have ongoing back pain associated with that. He has minimal back pain that is predominantly buttock and leg pain at this point; therefore as well, there is a possibility of a recurrent synovial cyst production and in which case it would be treated in a similar manner. However, given his comorbidities, I believe a minimally invasive approach at this point in time is prudent. Continue with his regular duties, Norco and Flexeril. I discussed this in front of the patient with Steve Johnson. We will obtain a preoperative clearance. We will recommend tobacco cessation as well. He has a history of liver disease and hepatitis C. We will proceed as one authorization has been obtained. I appreciate the kind referral and taking this evaluation by Dr. Ramos.  Plan microlumbar decompression L4-5 left, removal of synovial cyst  Zyliah Schier M. PA-C for Dr. Beane 09/08/2015, 4:39 PM    

## 2015-09-15 NOTE — Anesthesia Preprocedure Evaluation (Addendum)
Anesthesia Evaluation  Patient identified by MRN, date of birth, ID band Patient awake    Reviewed: Allergy & Precautions, NPO status , Patient's Chart, lab work & pertinent test results  History of Anesthesia Complications Negative for: history of anesthetic complications  Airway Mallampati: II  TM Distance: >3 FB Neck ROM: Full    Dental  (+) Edentulous Upper, Edentulous Lower   Pulmonary COPD, Current Smoker,    breath sounds clear to auscultation       Cardiovascular hypertension, (-) angina Rhythm:Regular Rate:Normal     Neuro/Psych Chronic back pain    GI/Hepatic negative GI ROS, (+) Hepatitis -  Endo/Other  diabetes (glu 172), Insulin Dependent, Oral Hypoglycemic AgentsMorbid obesity  Renal/GU negative Renal ROS     Musculoskeletal   Abdominal (+) + obese,   Peds  Hematology   Anesthesia Other Findings   Reproductive/Obstetrics                            Anesthesia Physical Anesthesia Plan  ASA: III  Anesthesia Plan: General   Post-op Pain Management:    Induction: Intravenous  Airway Management Planned: Oral ETT  Additional Equipment:   Intra-op Plan:   Post-operative Plan: Extubation in OR  Informed Consent: I have reviewed the patients History and Physical, chart, labs and discussed the procedure including the risks, benefits and alternatives for the proposed anesthesia with the patient or authorized representative who has indicated his/her understanding and acceptance.     Plan Discussed with: Surgeon and CRNA  Anesthesia Plan Comments: (Plan routine monitors, GETA)        Anesthesia Quick Evaluation

## 2015-09-15 NOTE — Anesthesia Procedure Notes (Signed)
Procedure Name: Intubation Date/Time: 09/15/2015 8:16 AM Performed by: Paris Lore Pre-anesthesia Checklist: Patient identified, Emergency Drugs available, Suction available, Patient being monitored and Timeout performed Patient Re-evaluated:Patient Re-evaluated prior to inductionOxygen Delivery Method: Circle system utilized Preoxygenation: Pre-oxygenation with 100% oxygen Intubation Type: IV induction Ventilation: Mask ventilation without difficulty Laryngoscope Size: Mac and 4 Grade View: Grade I Tube type: Oral Tube size: 7.5 mm Number of attempts: 1 Airway Equipment and Method: Stylet Placement Confirmation: ETT inserted through vocal cords under direct vision,  positive ETCO2,  CO2 detector and breath sounds checked- equal and bilateral Secured at: 23 cm Tube secured with: Tape Dental Injury: Teeth and Oropharynx as per pre-operative assessment

## 2015-09-15 NOTE — Discharge Instructions (Signed)

## 2015-09-15 NOTE — Brief Op Note (Signed)
09/15/2015  9:37 AM  PATIENT:  Sean Brooks  60 y.o. male  PRE-OPERATIVE DIAGNOSIS:  STENOSIS AND SYNOVIAL CYST LEFT L4-L5   POST-OPERATIVE DIAGNOSIS:  STENOSIS AND SYNOVIAL CYST LEFT L4-L5   PROCEDURE:  Procedure(s): MICRO LUMBAR DECOMPRESSION L4-L5 LEFT AND REMOVAL SYNOVIAL CYST  (Left)  SURGEON:  Surgeon(s) and Role:    * Jene Every, MD - Primary  PHYSICIAN ASSISTANT:   ASSISTANTS: Bissell   ANESTHESIA:   general  EBL:  Total I/O In: 1000 [I.V.:1000] Out: 200 [Urine:200]  BLOOD ADMINISTERED:none  DRAINS: none   LOCAL MEDICATIONS USED:  MARCAINE     SPECIMEN:  No Specimen  DISPOSITION OF SPECIMEN:  N/A  COUNTS:  YES  TOURNIQUET:  * No tourniquets in log *  DICTATION: .Other Dictation: Dictation Number [id number]  PLAN OF CARE: Admit for overnight observation  PATIENT DISPOSITION:  PACU - hemodynamically stable.   Delay start of Pharmacological VTE agent (>24hrs) due to surgical blood loss or risk of bleeding: yes

## 2015-09-15 NOTE — Transfer of Care (Signed)
Immediate Anesthesia Transfer of Care Note  Patient: Sean Brooks  Procedure(s) Performed: Procedure(s): MICRO LUMBAR DECOMPRESSION L4-L5 LEFT AND REMOVAL SYNOVIAL CYST  (Left)  Patient Location: PACU  Anesthesia Type:General  Level of Consciousness:  sedated, patient cooperative and responds to stimulation  Airway & Oxygen Therapy:Patient Spontanous Breathing and Patient connected to face mask oxgen  Post-op Assessment:  Report given to PACU RN and Post -op Vital signs reviewed and stable  Post vital signs:  Reviewed and stable  Last Vitals:  Filed Vitals:   09/15/15 0524  BP: 155/82  Pulse: 82  Temp: 36.6 C  Resp: 18    Complications: No apparent anesthesia complications

## 2015-09-15 NOTE — Interval H&P Note (Signed)
History and Physical Interval Note:  09/15/2015 8:03 AM  Sean Brooks  has presented today for surgery, with the diagnosis of STENOSIS AND SYNOVIAL CYST LEFT L4-L5   The various methods of treatment have been discussed with the patient and family. After consideration of risks, benefits and other options for treatment, the patient has consented to  Procedure(s): MICRO LUMBAR DECOMPRESSION L4-L5 LEFT AND REMOVAL SYNOVIAL CYST  (Left) as a surgical intervention .  The patient's history has been reviewed, patient examined, no change in status, stable for surgery.  I have reviewed the patient's chart and labs.  Questions were answered to the patient's satisfaction.     Genecis Veley C

## 2015-09-16 DIAGNOSIS — M4806 Spinal stenosis, lumbar region: Secondary | ICD-10-CM | POA: Diagnosis not present

## 2015-09-16 LAB — BASIC METABOLIC PANEL
ANION GAP: 8 (ref 5–15)
BUN: 13 mg/dL (ref 6–20)
CHLORIDE: 100 mmol/L — AB (ref 101–111)
CO2: 26 mmol/L (ref 22–32)
Calcium: 8.6 mg/dL — ABNORMAL LOW (ref 8.9–10.3)
Creatinine, Ser: 1.06 mg/dL (ref 0.61–1.24)
GFR calc non Af Amer: >60 mL/min (ref >60)
Glucose, Bld: 235 mg/dL — ABNORMAL HIGH (ref 65–99)
POTASSIUM: 4.8 mmol/L (ref 3.5–5.1)
SODIUM: 134 mmol/L — AB (ref 135–145)

## 2015-09-16 LAB — GLUCOSE, CAPILLARY: GLUCOSE-CAPILLARY: 254 mg/dL — AB (ref 65–99)

## 2015-09-16 MED ORDER — ASPIRIN 325 MG PO TABS: 325.0000 mg | ORAL_TABLET | Freq: Every morning | ORAL | Status: AC

## 2015-09-16 MED ORDER — METHOCARBAMOL 500 MG PO TABS: 500.0000 mg | ORAL_TABLET | Freq: Four times a day (QID) | ORAL | Status: AC | PRN

## 2015-09-16 NOTE — Op Note (Signed)
NAMEHAYES, CZAJA                 ACCOUNT NO.:  [id number]  MEDICAL RECORD NO.:  [id number]  LOCATION:  [facility]                         FACILITY:  [facility]  PHYSICIAN:  Jene Every, M.D.    DATE OF BIRTH:  10-Oct-1955  DATE OF PROCEDURE: DATE OF DISCHARGE:                              OPERATIVE REPORT   PREOPERATIVE DIAGNOSES:  Spinal stenosis, synovial cyst at L4-5.  POSTOPERATIVE DIAGNOSES:  Spinal stenosis, synovial cyst at L4-5.  PROCEDURES PERFORMED: 1. Microlumbar decompression centrally at L4-5, bilateral lateral     recess decompression at L4-5 with foraminotomies of L4 and L5. 2. Excision of synovial cyst. L4-5, left.  ANESTHESIA:  General.  ASSISTANT:  Lanna Poche, PA.  HISTORY:  This is a pleasant 60 year old gentleman who has had a persistent left lower extremity radicular pain, L5 nerve root distribution, L4 nerve root distribution secondary to spinal stenosis, ligamentum flavum hypertrophy, and synovial cyst.  He had a work related injury compressing the L4  and L5 root.  He was unable to recover.  Due to persistent symptoms, neural tension signs, EHL, quad weakness, he was indicated for decompression at L4-L5 and removal of synovial cyst. Risks and benefits were discussed including bleeding, infection, damage to neurovascular sheath, DVT, PE, anesthetic complications, no change in symptoms, worsening symptoms, etc.  TECHNIQUE:  With the patient in supine position after induction of adequate general anesthesia, 2 g Kefzol, placed prone on the Momence frame.  All bony prominence were well padded with  Foley to gravity. Lumbar region was prepped and draped in usual sterile fashion.  Two 18- gauge spinal needle was utilized to localize the L4-5 interspace, confirmed with x-ray.  Incision was made from spinous process L4-L5. Subcutaneous tissue was dissected.  Electrocautery was utilized to achieve hemostasis.  Dorsolumbar fascia was infiltrated with  0.25% Marcaine with epinephrine and was divided in line with the skin incision.  Paraspinous muscle elevated from lamina of L4-L5.  McCullough retractor was placed.  Confirmatory radiograph obtained.  Operating microscope was draped and brought on the surgical field.  There was an absence of an adequate interlaminar window on the left secondary to facet hypertrophy and spinous process hypertrophy.  I therefore elected to proceed centrally.  I removed the portion of the spinous process of L4, L5, and ligamentum flavum from the interspace.  Hemilaminotomy were were then performed bilaterally from the inferior caudate edge of 4 with a 2 and 3 mm Kerrison detaching the ligamentum flavum.  Hypertrophic facet on the left was partially removed with Leksell rongeur, approximately 30% of the medial border of the facet was removed. Ligamentum flavum detached from the cephalad edge of 5 utilizing a straight curette.  Neuro patties placed beneath the ligamentum flavum. Hypertrophic ligamentum flavum was noted bilaterally producing severe lateral recess stenosis, left greater than right.  With the neural elements well protected and after performing a foraminotomy of L5 and gently mobilized the L5 nerve root medially. I decompressed lateral recess the medial border of the pedicle on the right first and then on the left caudad.  Cephalad, there was the synovial cyst extending in the L4 foramen meticulously, developed a plane between the synovial cyst,  thecal sac, the foramen, and the L4 root.  They were all protected with neuro patties. We decompressed lateral recess of the medial border of the pedicle performing a foraminotomy of L4, the synovial cyst was removed with the pituitary with gelatinous fluid consistent with that seen with a normal synovial cyst from the facet, this was sent to Pathology.  The disc was not herniated.  Bipolar electrocautery was utilized to achieve hemostasis.  Following the  decompression, a neural probe passed freely up the foramen of L4-L5 and cephalad to above the pedicle of L4.  Good restoration of thecal sac without CSF leakage or active bleeding.  Copiously irrigated the wound and obtained a confirmatory radiograph with instruments in the foramen of L4 and L5. These were removed, copiously irrigated the wound.  Again, no evidence of CSF leakage or active bleeding.  We removed the [facility] retractors, irrigated the paraspinous musculature, closed the dorsolumbar fascia with 1 Vicryl, subcu with 2-0, and skin with subcuticular Prolene.  Sterile dressing applied.  Placed supine on the hospital bed, extubated without difficulty, and transported to the recovery room in satisfactory condition.  The patient tolerated the procedure well.  No complications.  Assistance of [place], [place].  Minimal blood loss.  After operative procedure, technical difficulty increased due to the patient's elevated BMI of 36.  This required use of extra long McCullough retractors and closure multiple layers of subcutaneous adipose tissue.     Jene Every, M.D.     Cordelia Pen  D:  09/15/2015  T:  09/15/2015  Job:  [id number]

## 2015-09-16 NOTE — Care Management Note (Signed)
Case Management Note  Patient Details  Name: Briston Lax MRN: [id number] Date of Birth: [birth year]  Subjective/Objective:                  STENOSIS AND SYNOVIAL CYST LEFT L4-L5  Action/Plan: Discharge planning Expected Discharge Date:  09/16/15               Expected Discharge Plan:  Home/Self Care  In-House Referral:     Discharge planning Services  CM Consult  Post Acute Care Choice:    Choice offered to:  Patient  DME Arranged:  N/A DME Agency:  NA  HH Arranged:  NA HH Agency:  NA  Status of Service:     Medicare Important Message Given:    Date Medicare IM Given:    Medicare IM give by:    Date Additional Medicare IM Given:    Additional Medicare Important Message give by:     If discussed at Long Length of Stay Meetings, dates discussed:    Additional Comments: CM notes pt has all DME at home and no [facility] services were recc or ordered.  No other CM needs were communicated. Yves Dill, RN 09/16/2015, 3:42 PM

## 2015-09-16 NOTE — Evaluation (Signed)
Occupational Therapy Evaluation Patient Details Name: Sean Brooks MRN: [id number] DOB: [birth year] Today's Date: 09/16/2015    History of Present Illness s/p L4-5 decompression   Clinical Impression   This 60 year old man was admitted for the above sx. All education was completed.  No further OT is needed at this time    Follow Up Recommendations  No OT follow up;Supervision - Intermittent    Equipment Recommendations   (pt will borrow 3:1 and shower seat)    Recommendations for Other Services       Precautions / Restrictions Precautions Precautions: Back Restrictions Weight Bearing Restrictions: No      Mobility Bed Mobility Overal bed mobility: Needs Assistance Bed Mobility: Supine to Sit     Supine to sit: Supervision     General bed mobility comments: cues for technique;  pt attempted to move legs off bed prior to being completely on his side  Transfers Overall transfer level: Needs assistance Equipment used: None Transfers: Sit to/from Stand Sit to Stand: Min guard         General transfer comment: for safety    Balance                                            ADL Overall ADL's : Needs assistance/impaired     Grooming: Supervision/safety;Sitting   Upper Body Bathing: Set up;Sitting   Lower Body Bathing: Moderate assistance;Sit to/from stand   Upper Body Dressing : Set up;Supervision/safety;Sitting   Lower Body Dressing: Moderate assistance;With adaptive equipment;Sit to/from stand   Toilet Transfer: Min guard;Stand-pivot (chair)   Toileting- Architect and Hygiene: Minimal assistance;Sit to/from stand         General ADL Comments: pt can borrow DME for bathroom.  Demonstrated side stepping over tub when ready; pt has fallen in tub. Recommend initially sponge bathing. Educated on Sports administrator and pt used while dressing.  He may be able to use backscratcher or clothes hanger for a little extra reach, if he  doesn't get reacher.  Educated on twisting during normal reaching. Pt verbalizes all education:  handout given     Vision     Perception     Praxis      Pertinent Vitals/Pain Pain Assessment: Faces Faces Pain Scale: Hurts little more Pain Location: back Pain Descriptors / Indicators: Sore Pain Intervention(s): Limited activity within patient's tolerance;Monitored during session;Premedicated before session;Repositioned     Hand Dominance     Extremity/Trunk Assessment Upper Extremity Assessment Upper Extremity Assessment: Overall WFL for tasks assessed           Communication Communication Communication: No difficulties   Cognition Arousal/Alertness: Awake/alert Behavior During Therapy: WFL for tasks assessed/performed Overall Cognitive Status: Within Functional Limits for tasks assessed                     General Comments       Exercises       Shoulder Instructions      Home Living Family/patient expects to be discharged to:: Private residence Living Arrangements: Alone     Home Access: Stairs to enter Entrance Stairs-Number of Steps: 2         Bathroom Shower/Tub: Tub/shower unit Shower/tub characteristics: Engineer, building services: Standard     Home Equipment: Cane - single point;Walker - 2 wheels (girl friend has shower seat/ 3:1 he can borrow)  Prior Functioning/Environment Level of Independence: Independent with assistive device(s)             OT Diagnosis: Acute pain   OT Problem List:     OT Treatment/Interventions:      OT Goals(Current goals can be found in the care plan section) Acute Rehab OT Goals Patient Stated Goal: home OT Goal Formulation: All assessment and education complete, DC therapy  OT Frequency:     Barriers to D/C:            Co-evaluation              End of Session    Activity Tolerance: Patient tolerated treatment well Patient left: in chair;with call bell/phone within reach    Time: 0805-0837 OT Time Calculation (min): 32 min Charges:  OT General Charges $OT Visit: 1 Procedure OT Evaluation $OT Eval Low Complexity: 1 Procedure OT Treatments $Self Care/Home Management : 8-22 mins G-Codes: OT G-codes **NOT FOR INPATIENT CLASS** Functional Assessment Tool Used: clinical observation and judgment Functional Limitation: Self care Self Care Current Status (E4540): At least 40 percent but less than 60 percent impaired, limited or restricted Self Care Goal Status (J8119): At least 40 percent but less than 60 percent impaired, limited or restricted Self Care Discharge Status [phone]): At least 40 percent but less than 60 percent impaired, limited or restricted  [facility] 09/16/2015, 8:49 AM   Marica Otter, OTR/L [phone] 09/16/2015

## 2015-09-16 NOTE — Progress Notes (Signed)
Foley catheter discontinued this morning. Pt due to void by noon today 09/17/15

## 2015-09-16 NOTE — Discharge Summary (Signed)
Physician Discharge Summary   Patient ID: Sean Brooks MRN: [id number] DOB/AGE: Jul 21, 1956 60 y.o.  Admit date: 09/15/2015 Discharge date: 09/16/2015  Primary Diagnosis:   STENOSIS AND SYNOVIAL CYST LEFT L4-L5   Admission Diagnoses:  Past Medical History  Diagnosis Date  . Hyperlipidemia   . Diabetes mellitus without complication ([place])   . Spinal stenosis   . Arthritis   . Hepatitis     HEP C    Discharge Diagnoses:   Principal Problem:   Spinal stenosis of lumbar region  Procedure:  Procedure(s) (LRB): MICRO LUMBAR DECOMPRESSION L4-L5 LEFT AND REMOVAL SYNOVIAL CYST  (Left)   Consults: None  HPI:  see H&P    Laboratory Data: Hospital Outpatient Visit on 09/10/2015  Component Date Value Ref Range Status  . Sodium 09/10/2015 139  135 - 145 mmol/L Final  . Potassium 09/10/2015 4.6  3.5 - 5.1 mmol/L Final  . Chloride 09/10/2015 107  101 - 111 mmol/L Final  . CO2 09/10/2015 24  22 - 32 mmol/L Final  . Glucose, Bld 09/10/2015 63* 65 - 99 mg/dL Final  . BUN 09/10/2015 18  6 - 20 mg/dL Final  . Creatinine, Ser 09/10/2015 1.11  0.61 - 1.24 mg/dL Final  . Calcium 09/10/2015 9.2  8.9 - 10.3 mg/dL Final  . GFR calc non Af Amer 09/10/2015 >60  >60 mL/min Final  . GFR calc Af Amer 09/10/2015 >60  >60 mL/min Final   Comment: (NOTE) The eGFR has been calculated using the CKD EPI equation. This calculation has not been validated in all clinical situations. eGFR's persistently <60 mL/min signify possible Chronic Kidney Disease.   . Anion gap 09/10/2015 8  5 - 15 Final  . WBC 09/10/2015 7.6  4.0 - 10.5 K/uL Final  . RBC 09/10/2015 5.09  4.22 - 5.81 MIL/uL Final  . Hemoglobin 09/10/2015 12.8* 13.0 - 17.0 g/dL Final  . HCT 09/10/2015 43.4  39.0 - 52.0 % Final  . MCV 09/10/2015 85.3  78.0 - 100.0 fL Final  . MCH 09/10/2015 25.1* 26.0 - 34.0 pg Final  . MCHC 09/10/2015 29.5* 30.0 - 36.0 g/dL Final  . RDW 09/10/2015 15.1  11.5 - 15.5 % Final  . Platelets 09/10/2015 373  150 -  400 K/uL Final  . Hgb A1c MFr Bld 09/10/2015 8.5* 4.8 - 5.6 % Final   Comment: (NOTE)         Pre-diabetes: 5.7 - 6.4         Diabetes: >6.4         Glycemic control for adults with diabetes: <7.0   . Mean Plasma Glucose 09/10/2015 197   Final   Comment: (NOTE) Performed At: [facility] [place], [place] [id number] Lindon Romp MD XY:[id number]   . MRSA, PCR 09/10/2015 NEGATIVE  NEGATIVE Final  . Staphylococcus aureus 09/10/2015 NEGATIVE  NEGATIVE Final   Comment:        The Xpert SA Assay (FDA approved for NASAL specimens in patients over 1 years of age), is one component of a comprehensive surveillance program.  Test performance has been validated by [facility] for patients greater than or equal to 56 year old. It is not intended to diagnose infection nor to guide or monitor treatment.    No results for input(s): HGB in the last 72 hours. No results for input(s): WBC, RBC, HCT, PLT in the last 72 hours.  Recent Labs  09/16/15 0538  NA 134*  K 4.8  CL 100*  CO2 26  BUN 13  CREATININE 1.06  GLUCOSE 235*  CALCIUM 8.6*   No results for input(s): LABPT, INR in the last 72 hours.  X-Rays:Dg Lumbar Spine 2-3 Views  09/10/2015  CLINICAL DATA:  Pain.  Surgery. EXAM: LUMBAR SPINE - 2-3 VIEW COMPARISON:  No prior . FINDINGS: Lumbar vertebra numbered with the lowest segmented appearing vertebrae on lateral view as L5 . Multilevel degenerative change lumbar spine. 4 mm anterolisthesis L4 on L5. No acute bony abnormality . IMPRESSION: Multilevel degenerative change with 5 mm anterolisthesis of L4 on L5. No acute abnormality . Electronically Signed   By: Marcello Moores  Register   On: 09/10/2015 10:47   Dg Spine Portable 1 View  09/15/2015  CLINICAL DATA:  L4-5 surgery. EXAM: PORTABLE SPINE - 1 VIEW COMPARISON:  Earlier today and 09/10/2015. FINDINGS: A portable lateral view of the lumbar spine demonstrates metallic localizer tips posterior to the inferior  aspect of the L4 vertebral body and posterior to the midportion of the L5 vertebral body. Stable multilevel degenerative changes and stable grade 1 anterolisthesis at the L4-5 level. IMPRESSION: Localizers at the L4 and L5 levels. Electronically Signed   By: Claudie Revering M.D.   On: 09/15/2015 09:48   Dg Spine Portable 1 View  09/15/2015  CLINICAL DATA:  Surgical level L4-5. Please number spinous processes. EXAM: PORTABLE SPINE - 1 VIEW COMPARISON:  Earlier same day FINDINGS: Tissue spreaders in the region of the spinous processes of L4 and L5 with probes directed between the spinous processes of L4 and L5. IMPRESSION: L4-5 level localized. Electronically Signed   By: Nelson Chimes M.D.   On: 09/15/2015 08:56   Dg Spine Portable 1 View  09/15/2015  CLINICAL DATA:  L4-5 surgery. EXAM: PORTABLE SPINE - 1 VIEW COMPARISON:  09/18/2015. FINDINGS: A single portable lateral view the lumbar spine demonstrates metallic localizers projected posterior to the L3 and L4 spinous processes. Stable multilevel degenerative changes. IMPRESSION: Localizers posterior to the L3 and L4 spinous processes. Electronically Signed   By: Claudie Revering M.D.   On: 09/15/2015 08:42    EKG: Orders placed or performed during the hospital encounter of 09/10/15  . EKG 12 lead  . EKG 12 lead     Hospital Course: Patient was admitted to [facility] and taken to the OR and underwent the above state procedure without complications.  Patient tolerated the procedure well and was later transferred to the recovery room and then to the orthopaedic floor for postoperative care.  They were given PO and IV analgesics for pain control following their surgery.  They were given 24 hours of postoperative antibiotics.   PT was consulted postop to assist with mobility and transfers.  The patient was allowed to be WBAT with therapy and was taught back precautions. Discharge planning was consulted to help with postop disposition and equipment needs.   Patient had a good night on the evening of surgery and started to get up OOB with therapy on day one. Patient was seen in rounds and was ready to go home on day one.  They were given discharge instructions and dressing directions.  They were instructed on when to follow up in the office with Dr. Tonita Cong.   Diet: Diabetic diet Activity:WBAT; Lspine precautions Follow-up:in 10-14 days Disposition - Home Discharged Condition: good   Discharge Instructions    Call MD / Call 911    Complete by:  As directed   If you experience chest pain or shortness of  breath, CALL 911 and be transported to the hospital emergency room.  If you develope a fever above 101 F, pus (white drainage) or increased drainage or redness at the wound, or calf pain, call your surgeon's office.     Constipation Prevention    Complete by:  As directed   Drink plenty of fluids.  Prune juice may be helpful.  You may use a stool softener, such as Colace (over the counter) 100 mg twice a day.  Use MiraLax (over the counter) for constipation as needed.     Diet - low sodium heart healthy    Complete by:  As directed      Increase activity slowly as tolerated    Complete by:  As directed             Medication List    STOP taking these medications        HYDROcodone-acetaminophen 10-325 MG tablet  Commonly known as:  NORCO      TAKE these medications        aspirin 325 MG tablet  Take 1 tablet (325 mg total) by mouth every morning. Resume 4 days post-op     atorvastatin 40 MG tablet  Commonly known as:  LIPITOR  Take 40 mg by mouth at bedtime.     docusate sodium 100 MG capsule  Commonly known as:  COLACE  Take 1 capsule (100 mg total) by mouth 2 (two) times daily as needed for mild constipation.     gabapentin 600 MG tablet  Commonly known as:  NEURONTIN  Take 600 mg by mouth 2 (two) times daily.     HUMALOG 100 UNIT/ML injection  Generic drug:  insulin lispro  Inject 15 Units into the skin 3 (three) times  daily. 15 in, 20 units at lunch, 10 units at bedtime.     insulin detemir 100 UNIT/ML injection  Commonly known as:  LEVEMIR  Inject 55-65 Units into the skin at bedtime. 65 units in the am, 55 units at night.     lisinopril 20 MG tablet  Commonly known as:  PRINIVIL,ZESTRIL  Take 20 mg by mouth every morning.     metFORMIN 1000 MG tablet  Commonly known as:  GLUCOPHAGE  Take 1,000 mg by mouth 2 (two) times daily.     methocarbamol 500 MG tablet  Commonly known as:  ROBAXIN  Take 1 tablet (500 mg total) by mouth every 6 (six) hours as needed for muscle spasms.     oxyCODONE-acetaminophen 5-325 MG tablet  Commonly known as:  PERCOCET  Take 1-2 tablets by mouth every 4 (four) hours as needed.           Follow-up Information    Follow up with Johnn Hai, MD.   Specialty:  Orthopedic Surgery   Contact information:   [address] [place] [address] [phone]       Signed: Lacie Draft, PA-C Orthopaedic Surgery 09/16/2015, 10:09 AM

## 2015-09-16 NOTE — Evaluation (Signed)
Physical Therapy Evaluation Patient Details Name: Sean Brooks MRN: [id number] DOB: August 18, 1956 Today's Date: 09/16/2015   History of Present Illness  s/p L4-5 decompression, and cyst excision  Clinical Impression  Pt ambulated 260' with RW independently, no loss of balance. Reviewed back precautions and encouraged frequent ambulation. He is ready to DC home from PT standpoint.     Follow Up Recommendations No PT follow up    Equipment Recommendations  None recommended by PT    Recommendations for Other Services       Precautions / Restrictions Precautions Precautions: Back Precaution Booklet Issued: Yes (comment) Restrictions Weight Bearing Restrictions: No      Mobility  Bed Mobility Overal bed mobility: Needs Assistance Bed Mobility: Supine to Sit     Supine to sit: Supervision     General bed mobility comments: cues for technique;  pt attempted to move legs off bed prior to being completely on his side  Transfers Overall transfer level: Modified independent Equipment used: Rolling walker (2 wheeled) Transfers: Sit to/from Stand Sit to Stand: Min guard         General transfer comment: verbal cues for hand placement  Ambulation/Gait Ambulation/Gait assistance: Modified independent (Device/Increase time) Ambulation Distance (Feet): 260 Feet Assistive device: Rolling walker (2 wheeled) Gait Pattern/deviations: WFL(Within Functional Limits)     General Gait Details: steady with RW, no LOB, good posture; pt declined stair training, stated he won't have trouble doing them  Stairs            Wheelchair Mobility    Modified Rankin (Stroke Patients Only)       Balance Overall balance assessment: Modified Independent                                           Pertinent Vitals/Pain Pain Assessment: 0-10 Pain Score: 5  Faces Pain Scale: Hurts little more Pain Location: back Pain Descriptors / Indicators: Sore Pain  Intervention(s): Premedicated before session;Monitored during session;Limited activity within patient's tolerance    Home Living Family/patient expects to be discharged to:: Private residence Living Arrangements: Alone Available Help at Discharge: Available PRN/intermittently   Home Access: Stairs to enter   Entrance Stairs-Number of Steps: 2 Home Layout: One level Home Equipment: Cane - single point;Walker - 2 wheels (girl friend has shower seat/ 3:1 he can borrow)      Prior Function Level of Independence: Independent with assistive device(s)               Hand Dominance        Extremity/Trunk Assessment   Upper Extremity Assessment: Overall WFL for tasks assessed           Lower Extremity Assessment: Overall WFL for tasks assessed (sensation intact to light touch)      Cervical / Trunk Assessment: Normal  Communication   Communication: No difficulties  Cognition Arousal/Alertness: Awake/alert Behavior During Therapy: WFL for tasks assessed/performed Overall Cognitive Status: Within Functional Limits for tasks assessed                      General Comments      Exercises        Assessment/Plan    PT Assessment Patent does not need any further PT services  PT Diagnosis Acute pain   PT Problem List    PT Treatment Interventions     PT Goals (Current  goals can be found in the Care Plan section) Acute Rehab PT Goals Patient Stated Goal: return to work for city of [place] PT Goal Formulation: All assessment and education complete, DC therapy    Frequency     Barriers to discharge        Co-evaluation               End of Session Equipment Utilized During Treatment: Gait belt Activity Tolerance: Patient tolerated treatment well Patient left: in chair;with call bell/phone within reach Nurse Communication: Mobility status    Functional Assessment Tool Used: clinical judgement Functional Limitation: Mobility: Walking and  moving around Mobility: Walking and Moving Around Current Status (Z6109): At least 1 percent but less than 20 percent impaired, limited or restricted Mobility: Walking and Moving Around Goal Status [phone]): At least 1 percent but less than 20 percent impaired, limited or restricted Mobility: Walking and Moving Around Discharge Status [phone]): At least 1 percent but less than 20 percent impaired, limited or restricted    Time: 0842-0903 PT Time Calculation (min) (ACUTE ONLY): 21 min   Charges:   PT Evaluation $PT Eval Low Complexity: 1 Procedure     PT G Codes:   PT G-Codes **NOT FOR INPATIENT CLASS** Functional Assessment Tool Used: clinical judgement Functional Limitation: Mobility: Walking and moving around Mobility: Walking and Moving Around Current Status (B1478): At least 1 percent but less than 20 percent impaired, limited or restricted Mobility: Walking and Moving Around Goal Status [phone]): At least 1 percent but less than 20 percent impaired, limited or restricted Mobility: Walking and Moving Around Discharge Status [phone]): At least 1 percent but less than 20 percent impaired, limited or restricted    Tamala Ser 09/16/2015, 9:08 AM [phone]

## 2015-09-16 NOTE — Progress Notes (Signed)
Pt d/c home. Prescriptions given and discussed with patient. Pain assessed and patient medicated before d/c. No DME needs. AVS reviewed with patient, no questions or concerns. Patient understands that he needs to call to make a follow-up appointment with Dr. Shelle Iron.

## 2015-09-16 NOTE — Progress Notes (Signed)
Subjective: 1 Day Post-Op Procedure(s) (LRB): MICRO LUMBAR DECOMPRESSION L4-L5 LEFT AND REMOVAL SYNOVIAL CYST  (Left) Patient reports pain as mild.  Reports some incisional back pain. No leg pain currently. Has been OOB with PT tolerated well. Pain well controlled.   Objective: Vital signs in last 24 hours: Temp:  [97 F (36.1 C)-100 F (37.8 C)] 98.7 F (37.1 C) (01/26 0527) Pulse Rate:  [79-106] 104 (01/26 0527) Resp:  [12-28] 16 (01/26 0527) BP: (117-165)/(52-98) 140/98 mmHg (01/26 0527) SpO2:  [94 %-100 %] 100 % (01/26 0527) Weight:  [112.492 kg (248 lb)] 112.492 kg (248 lb) (01/25 1247)  Intake/Output from previous day: 01/25 0701 - 01/26 0700 In: 4010.3 [P.O.:1642; I.V.:2313.3; IV Piggyback:55] Out: 2200 [Urine:2200] Intake/Output this shift: Total I/O In: 236.7 [P.O.:120; I.V.:116.7] Out: -   No results for input(s): HGB in the last 72 hours. No results for input(s): WBC, RBC, HCT, PLT in the last 72 hours.  Recent Labs  09/16/15 0538  NA 134*  K 4.8  CL 100*  CO2 26  BUN 13  CREATININE 1.06  GLUCOSE 235*  CALCIUM 8.6*   No results for input(s): LABPT, INR in the last 72 hours.  Neurologically intact ABD soft Neurovascular intact Sensation intact distally Intact pulses distally Dorsiflexion/Plantar flexion intact Incision: dressing C/D/I and no drainage No cellulitis present  Compartments soft No sign of DVT  Assessment/Plan: 1 Day Post-Op Procedure(s) (LRB): MICRO LUMBAR DECOMPRESSION L4-L5 LEFT AND REMOVAL SYNOVIAL CYST  (Left) Advance diet Up with therapy D/C IV fluids  Discussed D/C instructions, Lspine precautions, dressing instructions D/C home today Glycemic control Follow up in 2 weeks for suture removal   Sean Brooks M. 09/16/2015, 10:08 AM

## 2017-01-30 IMAGING — DX DG LUMBAR SPINE 2-3V
2 series · 2 of 2 positions shown · non-contrast
Comparison: No prior .

CLINICAL DATA: Pain.  Surgery.

EXAM:
LUMBAR SPINE - 2-3 VIEW

[l-spine ap]
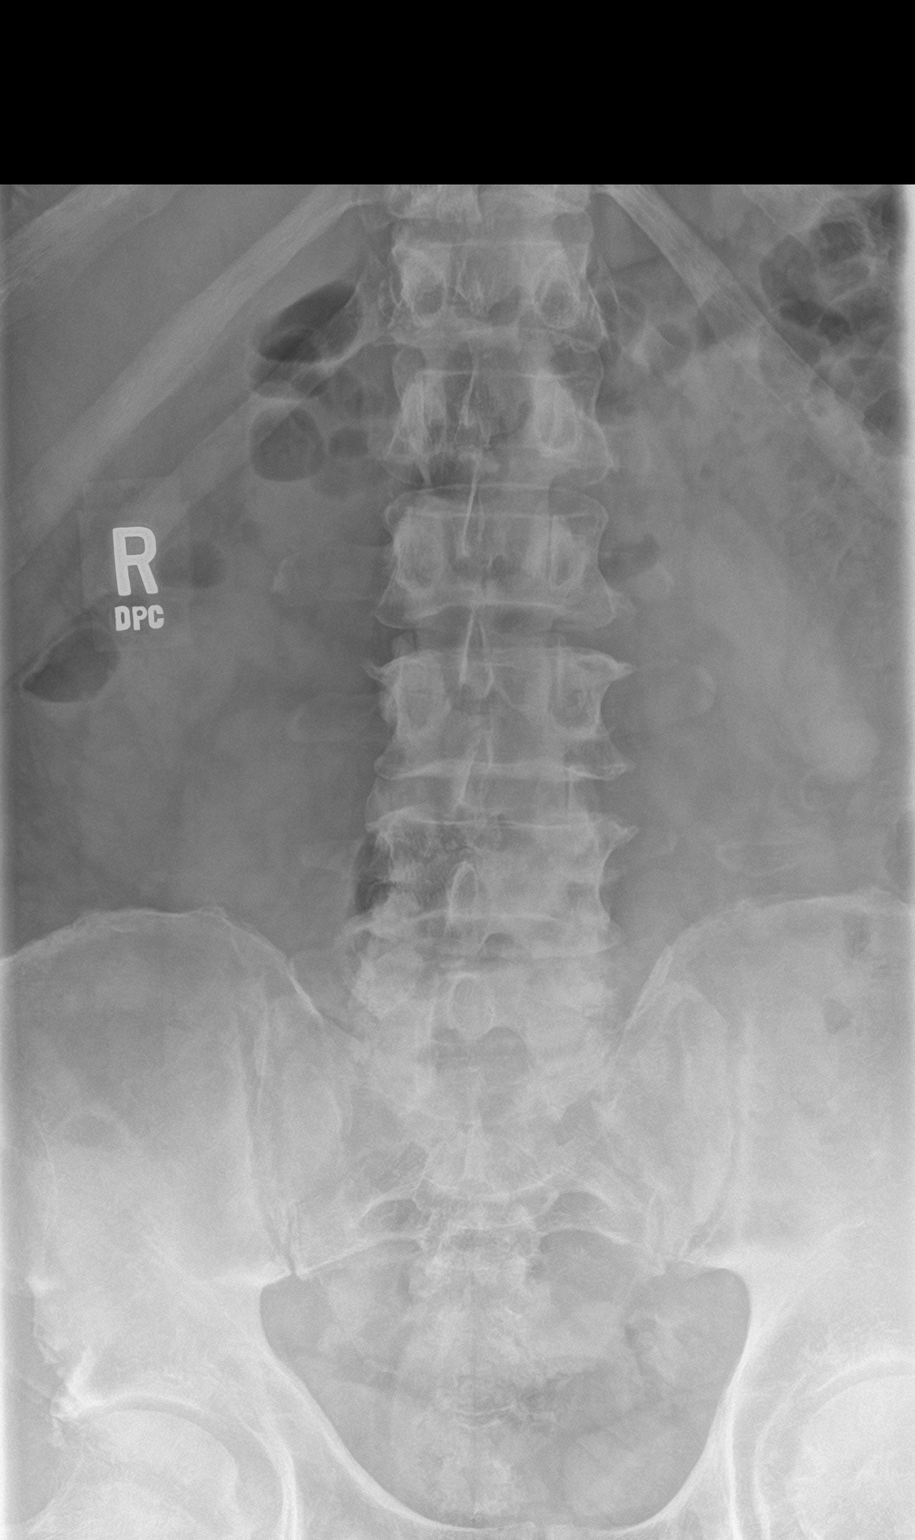

[l-spine lat]
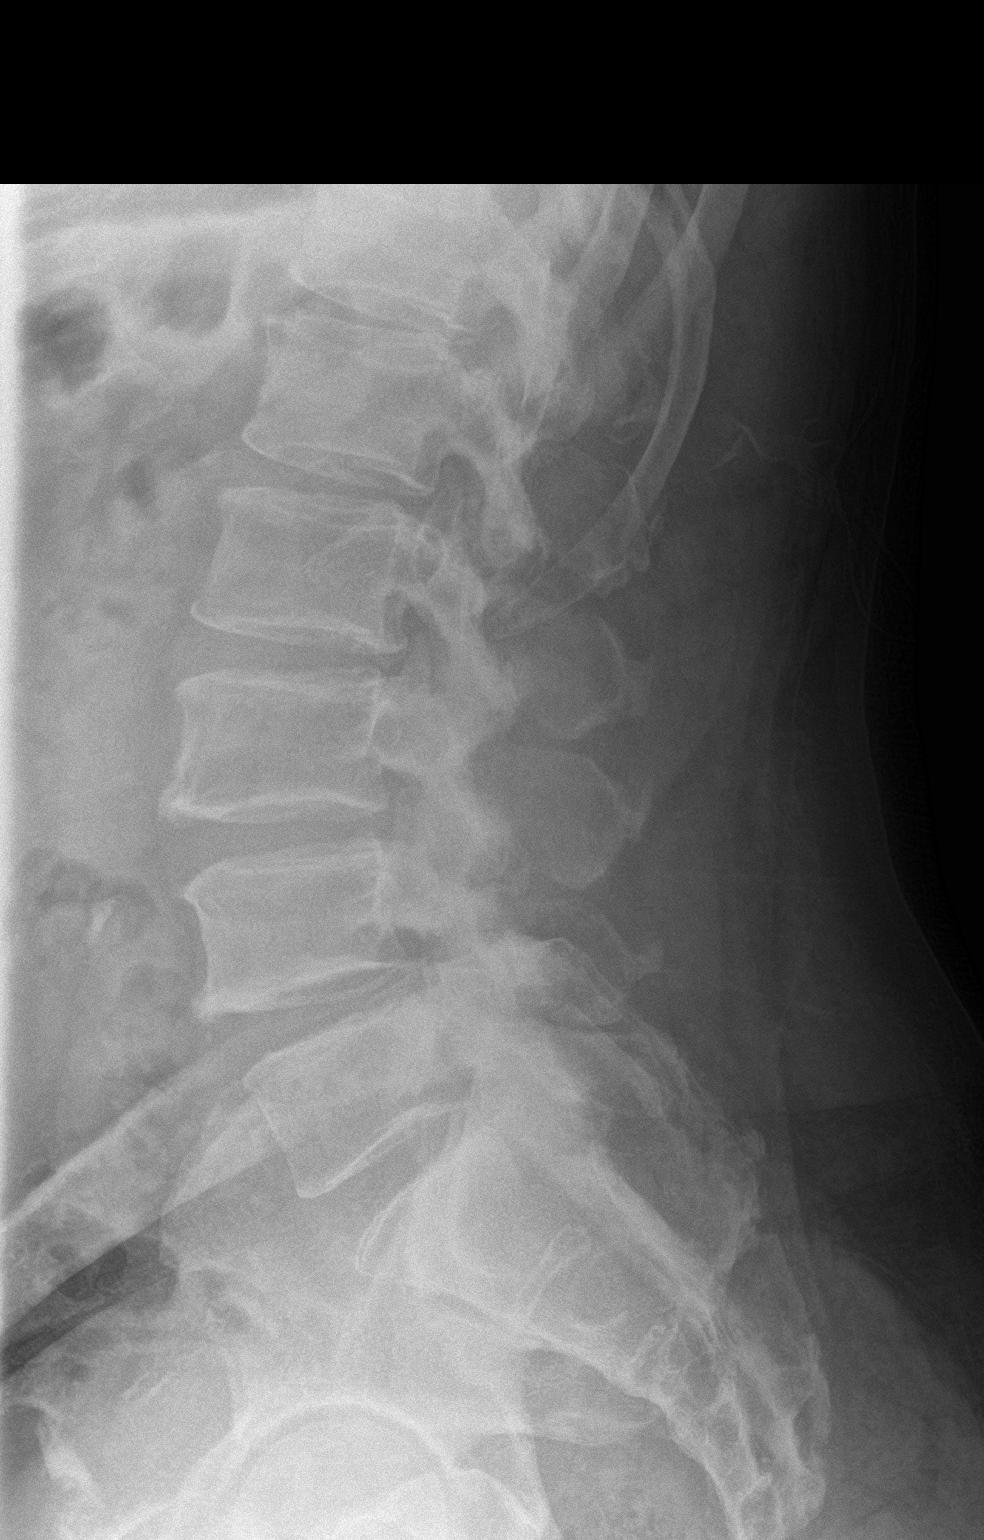

[2 of 2 positions shown; findings below may reference images not displayed]

FINDINGS: Lumbar vertebra numbered with the lowest segmented appearing
vertebrae on lateral view as L5 . Multilevel degenerative change
lumbar spine. 4 mm anterolisthesis L4 on L5. No acute bony
abnormality .
IMPRESSION: Multilevel degenerative change with 5 mm anterolisthesis of L4 on
L5. No acute abnormality .

## 2017-02-04 IMAGING — DX DG SPINE 1V PORT
1 series · 1 of 1 positions shown · non-contrast
Comparison: Earlier today and 09/10/2015.

CLINICAL DATA: L4-5 surgery.

EXAM:
PORTABLE SPINE - 1 VIEW

[l-spine x-table]
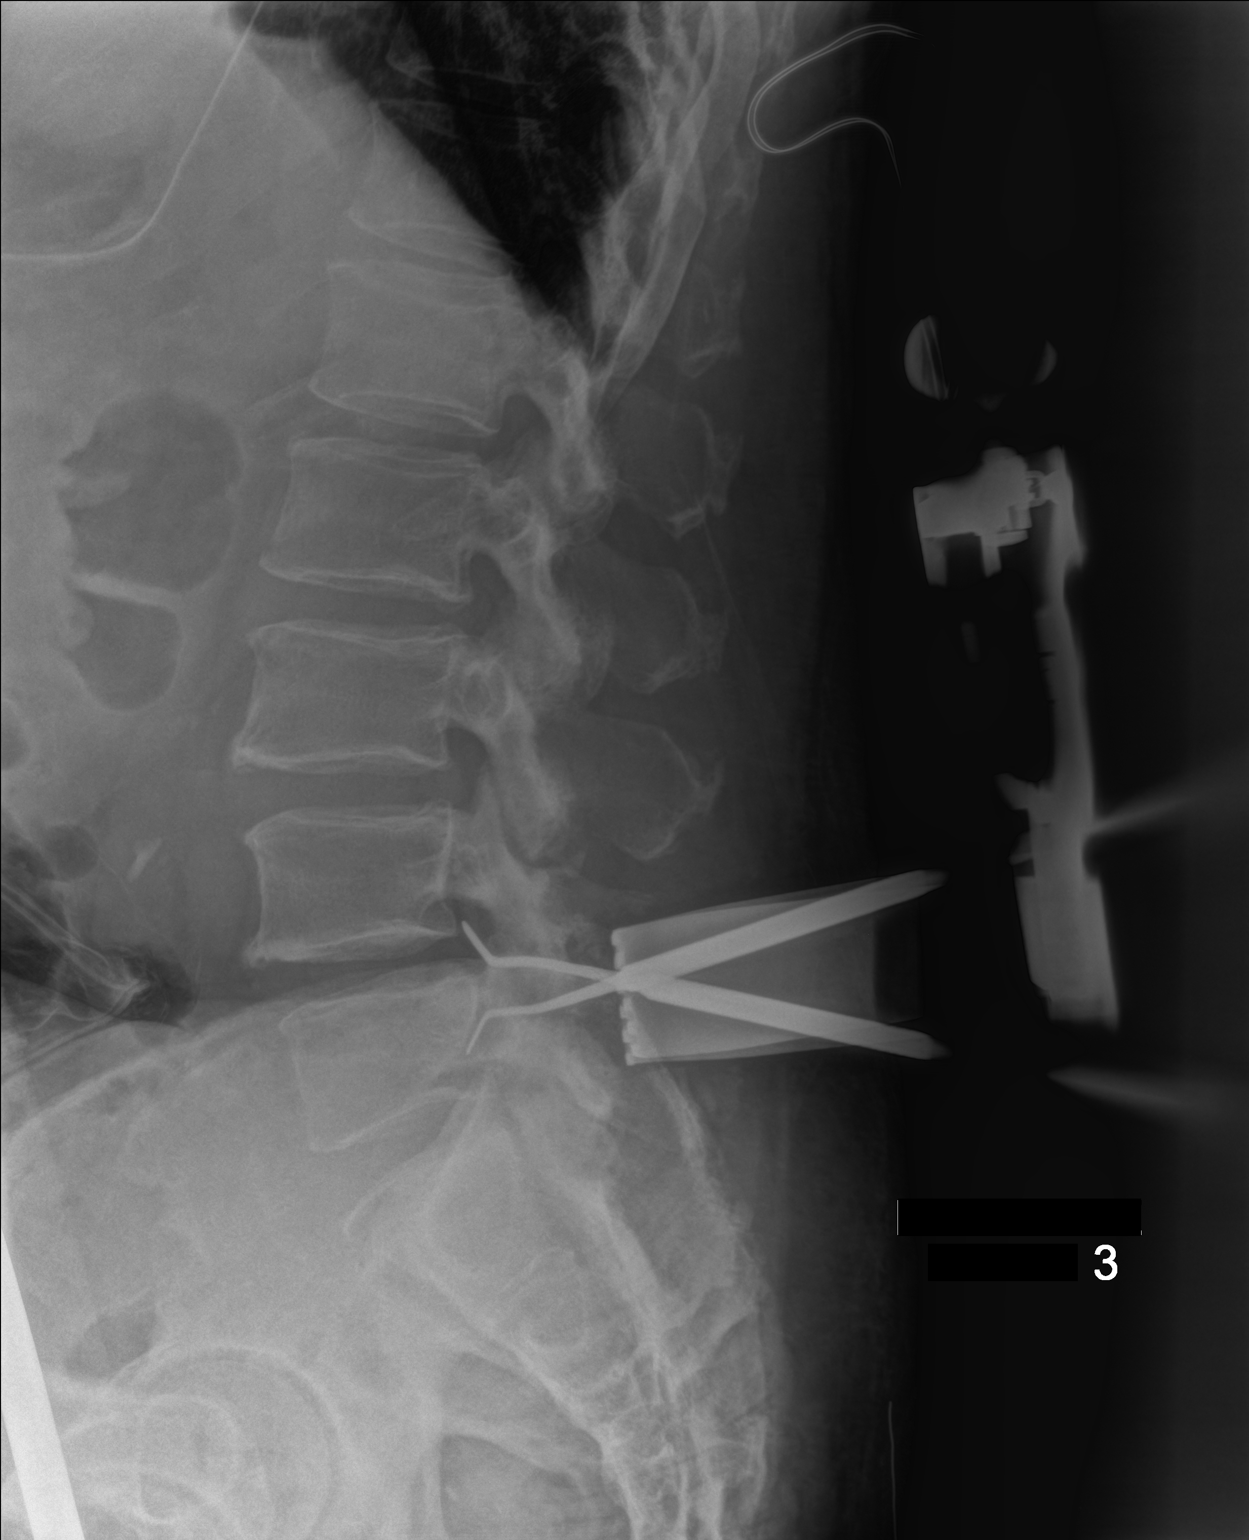

[1 of 1 positions shown; findings below may reference images not displayed]

FINDINGS: A portable lateral view of the lumbar spine demonstrates metallic
localizer tips posterior to the inferior aspect of the L4 vertebral
body and posterior to the midportion of the L5 vertebral body.
Stable multilevel degenerative changes and stable grade 1
anterolisthesis at the L4-5 level.
IMPRESSION: Localizers at the L4 and L5 levels.
